# Patient Record
Sex: Female | Born: 1995 | Race: White | Hispanic: No | Marital: Single | Smoking: Never smoker
Health system: Southern US, Community
[De-identification: ages and names within clinical notes are randomized; demographics above are authoritative.]

## PROBLEM LIST (undated history)

## (undated) DIAGNOSIS — O139 Gestational [pregnancy-induced] hypertension without significant proteinuria, unspecified trimester: Secondary | ICD-10-CM

## (undated) HISTORY — PX: WISDOM TOOTH EXTRACTION: SHX21

## (undated) HISTORY — PX: CHOLECYSTECTOMY: SHX55

---
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
## 2016-01-10 ENCOUNTER — Encounter: Payer: Self-pay | Admitting: Emergency Medicine

## 2016-01-10 ENCOUNTER — Emergency Department (INDEPENDENT_AMBULATORY_CARE_PROVIDER_SITE_OTHER)

## 2016-01-10 ENCOUNTER — Emergency Department (INDEPENDENT_AMBULATORY_CARE_PROVIDER_SITE_OTHER)
Admission: EM | Admit: 2016-01-10 | Discharge: 2016-01-10 | Disposition: A | Discharge status: Home / Self Care | Source: Home / Self Care | Attending: Family Medicine | Admitting: Family Medicine

## 2016-01-10 DIAGNOSIS — R509 Fever, unspecified: Secondary | ICD-10-CM

## 2016-01-10 DIAGNOSIS — R0781 Pleurodynia: Secondary | ICD-10-CM | POA: Diagnosis not present

## 2016-01-10 DIAGNOSIS — J40 Bronchitis, not specified as acute or chronic: Secondary | ICD-10-CM

## 2016-01-10 DIAGNOSIS — R05 Cough: Secondary | ICD-10-CM | POA: Diagnosis not present

## 2016-01-10 MED ORDER — AZITHROMYCIN 250 MG PO TABS: ORAL_TABLET | ORAL | 0 refills | Status: DC

## 2016-01-10 NOTE — ED Triage Notes (Signed)
Patient reports cough for 3 days that has led to pain in right lateral ribcage. She is breastfeeding her infant.

## 2016-01-10 NOTE — ED Provider Notes (Signed)
[facility]    CSN: [id number] Arrival date & time: 01/10/16  1638     History   Chief Complaint Chief Complaint  Patient presents with  . Cough  . Chest Pain    HPI Emily Proctor is a 20 y.o. female.   Patient complains of three day history of typical cold-like symptoms developing over several days, including mild sore throat, sinus congestio, fatigue, low grade fever, and cough.  She has now developed pleuritic pain in her right chest and occasional wheezing.  No shortness of breath.  She is presently breast feeding her infant.      Past Medical History:  Diagnosis Date  . Gestational hypertension    return to nml at pp exam    There are no active problems to display for this patient.   History reviewed. No pertinent surgical history.  OB History    No data available       Home Medications    Prior to Admission medications   Medication Sig Start Date End Date Taking? Authorizing Provider  Prenatal Vit-Fe Fumarate-FA (PRENATAL VITAMINS) 28-0.8 MG TABS Take by mouth.   Yes Historical Provider, MD  azithromycin (ZITHROMAX Z-PAK) 250 MG tablet Take 2 tabs today; then begin one tab once daily for 4 more days. 01/10/16   Lattie HawStephen A Aland Chestnutt, MD    Family History History reviewed. No pertinent family history.  Social History Social History  Substance Use Topics  . Smoking status: Never Smoker  . Smokeless tobacco: Never Used  . Alcohol use No     Allergies   Patient has no known allergies.   Review of Systems Review of Systems + sore throat + cough + pleuritic pain + wheezing + nasal congestion + post-nasal drainage No sinus pain/pressure No itchy/red eyes No earache No hemoptysis No SOB + fever, + chills No nausea No vomiting No abdominal pain No diarrhea No urinary symptoms No skin rash + fatigue No myalgias No headache Used OTC meds without relief   Physical Exam Triage Vital Signs ED Triage Vitals  Enc Vitals  Group     BP 01/10/16 1702 115/75     Pulse Rate 01/10/16 1702 107     Resp 01/10/16 1702 16     Temp 01/10/16 1702 98.7 F (37.1 C)     Temp Source 01/10/16 1702 Oral     SpO2 01/10/16 1702 97 %     Weight 01/10/16 1703 218 lb (98.9 kg)     Height 01/10/16 1703 5' 6.5" (1.689 m)     Head Circumference --      Peak Flow --      Pain Score 01/10/16 1705 1     Pain Loc --      Pain Edu? --      Excl. in GC? --    No data found.   Updated Vital Signs BP 115/75 (BP Location: Left Arm)   Pulse 107   Temp 98.7 F (37.1 C) (Oral)   Resp 16   Ht 5' 6.5" (1.689 m)   Wt 218 lb (98.9 kg)   LMP 08/07/2015 Comment: breast feeding  SpO2 97%   BMI 34.66 kg/m   Visual Acuity Right Eye Distance:   Left Eye Distance:   Bilateral Distance:    Right Eye Near:   Left Eye Near:    Bilateral Near:     Physical Exam  Pulmonary/Chest:        Nursing notes and Vital Signs reviewed. Appearance:  Patient appears stated age, and in no acute distress Eyes:  Pupils are equal, round, and reactive to light and accomodation.  Extraocular movement is intact.  Conjunctivae are not inflamed  Ears:  Canals normal.  Tympanic membranes normal.  Nose:  Mildly congested turbinates.  No sinus tenderness.   Pharynx:  Normal Neck:  Supple.  Tender enlarged posterior/lateral nodes are palpated bilaterally  Lungs:  Clear to auscultation.  Breath sounds are equal.  Moving air well. Chest:  Tenderness right chest as noted on diagram. Heart:  Regular rate and rhythm without murmurs, rubs, or gallops.  Abdomen:  Nontender without masses or hepatosplenomegaly.  Bowel sounds are present.  No CVA or flank tenderness.  Extremities:  No edema.  Skin:  No rash present.     UC Treatments / Results  Labs (all labs ordered are listed, but only abnormal results are displayed) Labs Reviewed - No data to display  EKG  EKG Interpretation None       Radiology No results found.  Procedures Procedures  (including critical care time)  Medications Ordered in UC Medications - No data to display   Initial Impression / Assessment and Plan / UC Course  I have reviewed the triage vital signs and the nursing notes.  Pertinent labs & imaging results that were available during my care of the patient were reviewed by me and considered in my medical decision making (see chart for details).  Clinical Course   Begin Z-pak for atypical coverage. Take plain guaifenesin (1200mg  extended release tabs such as Mucinex) twice daily, with plenty of water, for cough and congestion.  Get adequate rest.   May take Delsym Cough Suppressant at bedtime for nighttime cough.  Try warm salt water gargles for sore throat.  Stop all antihistamines for now, and other non-prescription cough/cold preparations. May take Ibuprofen 200mg , 4 tabs every 8 hours with food for chest/sternum discomfort.   Follow-up with family doctor if not improving about 7 to 10 days.      Final Clinical Impressions(s) / UC Diagnoses   Final diagnoses:  Bronchitis  Rib pain on right side    New Prescriptions Discharge Medication List as of 01/10/2016  7:30 PM    START taking these medications   Details  azithromycin (ZITHROMAX Z-PAK) 250 MG tablet Take 2 tabs today; then begin one tab once daily for 4 more days., Print         Lattie HawStephen A Mahoganie Basher, MD 01/18/16 [phone]

## 2016-01-10 NOTE — Discharge Instructions (Signed)
Take plain guaifenesin (1200mg  extended release tabs such as Mucinex) twice daily, with plenty of water, for cough and congestion.  Get adequate rest.   May take Delsym Cough Suppressant at bedtime for nighttime cough.  Try warm salt water gargles for sore throat.  Stop all antihistamines for now, and other non-prescription cough/cold preparations. May take Ibuprofen 200mg , 4 tabs every 8 hours with food for chest/sternum discomfort.   Follow-up with family doctor if not improving about 7 to 10 days.

## 2017-06-12 ENCOUNTER — Emergency Department (INDEPENDENT_AMBULATORY_CARE_PROVIDER_SITE_OTHER)

## 2017-06-12 ENCOUNTER — Emergency Department (INDEPENDENT_AMBULATORY_CARE_PROVIDER_SITE_OTHER)
Admission: EM | Admit: 2017-06-12 | Discharge: 2017-06-12 | Disposition: A | Discharge status: Home / Self Care | Source: Home / Self Care | Attending: Emergency Medicine | Admitting: Emergency Medicine

## 2017-06-12 ENCOUNTER — Encounter: Payer: Self-pay | Admitting: Emergency Medicine

## 2017-06-12 ENCOUNTER — Other Ambulatory Visit: Payer: Self-pay

## 2017-06-12 DIAGNOSIS — X501XXA Overexertion from prolonged static or awkward postures, initial encounter: Secondary | ICD-10-CM

## 2017-06-12 DIAGNOSIS — S6981XA Other specified injuries of right wrist, hand and finger(s), initial encounter: Secondary | ICD-10-CM

## 2017-06-12 DIAGNOSIS — S5331XA Traumatic rupture of right ulnar collateral ligament, initial encounter: Secondary | ICD-10-CM

## 2017-06-12 DIAGNOSIS — S63641A Sprain of metacarpophalangeal joint of right thumb, initial encounter: Secondary | ICD-10-CM

## 2017-06-12 NOTE — ED Triage Notes (Signed)
Right first finger hyper-extended 3 weeks ago, still is painful

## 2017-06-12 NOTE — Discharge Instructions (Addendum)
Wear splint as instructed. Please make an appointment to be seen next week at [facility] family medicine by Dr. Denyse Amass or Dr.Thekkekandam. Do not wear splint at night.

## 2017-06-12 NOTE — ED Provider Notes (Signed)
[facility]    CSN: [id number] Arrival date & time: 06/12/17  1524     History   Chief Complaint Chief Complaint  Patient presents with  . Finger Injury    HPI Brynlynn Walko is a 22 y.o. female.   HPI Patient states that 3 weeks ago she ran out to prevent her 42-year-old from running into the street and she suffered an extension injury to her right thumb.  She has been able to use her thumb but it has remained sore.  It does not feel as strong as her left thumb.  She has difficulty gripping a pencil and writing in school.  She has had improvement in her swelling but feels like she has some limitation of motion at the base of the thumb.  Last menstrual period was 2 weeks ago and she has not been sexually active since that time.   Past Medical History:  Diagnosis Date  . Gestational hypertension    return to nml at pp exam    There are no active problems to display for this patient.   History reviewed. No pertinent surgical history.  OB History   None      Home Medications    Prior to Admission medications   Medication Sig Start Date End Date Taking? Authorizing Provider  Prenatal Vit-Fe Fumarate-FA (PRENATAL VITAMINS) 28-0.8 MG TABS Take by mouth.    [provider]    Family History No family history on file.  Social History Social History   Tobacco Use  . Smoking status: Never Smoker  . Smokeless tobacco: Never Used  Substance Use Topics  . Alcohol use: No  . Drug use: Not on file     Allergies   Patient has no known allergies.   Review of Systems Review of Systems  Musculoskeletal:       Pain in the right thumb.  No discomfort in her wrist elbow or fingers.     Physical Exam Triage Vital Signs ED Triage Vitals  Enc Vitals Group     BP      Pulse      Resp      Temp      Temp src      SpO2      Weight      Height      Head Circumference      Peak Flow      Pain Score      Pain Loc      Pain Edu?    Excl. in GC?    No data found.  Updated Vital Signs BP 111/80 (BP Location: Right Arm)   Pulse 94   Temp 97.9 F (36.6 C) (Oral)   Ht  (1.702 m)   Wt 253 lb (114.8 kg)   LMP 05/25/2017   SpO2 98%   BMI 39.63 kg/m   Visual Acuity Right Eye Distance:   Left Eye Distance:   Bilateral Distance:    Right Eye Near:   Left Eye Near:    Bilateral Near:     Physical Exam  Constitutional: She appears well-developed and well-nourished.  Musculoskeletal:  There is tenderness at the base of the proximal phalanx of the right thumb.  There appears to be laxity of the ulnar collateral ligament.  There is discomfort noted with stressing of the medial and lateral collateral ligaments.  There is normal flexion extension.  Capillary filling is normal.     UC Treatments / Results  Labs (all labs ordered are listed, but only abnormal results are displayed) Labs Reviewed - No data to display  EKG None  Radiology Dg Finger Thumb Right  Result Date: 06/12/2017 CLINICAL DATA:  Right first MCP pain. Hyperextension injury to the right thumb 3 weeks ago. EXAM: RIGHT THUMB 2+V COMPARISON:  None. FINDINGS: There is no evidence of acute fracture or dislocation. Thumb IP and MCP joint space widths are preserved without significant arthropathic changes seen. A 5 mm densely sclerotic focus in the lunate may represent a bone island. The soft tissues are unremarkable. IMPRESSION: No acute osseous abnormality. Electronically Signed   By: Sebastian Ache M.D.   On: 06/12/2017 16:06    Procedures Procedures (including critical care time)  Medications Ordered in UC Medications - No data to display  Initial Impression / Assessment and Plan / UC Course  I have reviewed the triage vital signs and the nursing notes.  Pertinent labs & imaging results that were available during my care of the patient were reviewed by me and considered in my medical decision making (see chart for details).      Final  Clinical Impressions(s) / UC Diagnoses   Final diagnoses:  Gamekeeper's thumb of right hand, initial encounter     Discharge Instructions     Wear splint as instructed. Please make an appointment to be seen next week at [facility] family medicine by Dr. Denyse Amass or Dr.Thekkekandam. Do not wear splint at night.    ED Prescriptions    None     Controlled Substance Prescriptions [place] Controlled Substance Registry consulted? Not Applicable   Collene Gobble, MD 06/12/17 1640

## 2017-06-17 ENCOUNTER — Encounter: Payer: Self-pay | Admitting: Sports Medicine

## 2017-06-17 ENCOUNTER — Ambulatory Visit (INDEPENDENT_AMBULATORY_CARE_PROVIDER_SITE_OTHER): Payer: Self-pay | Admitting: Sports Medicine

## 2017-06-17 DIAGNOSIS — S5331XA Traumatic rupture of right ulnar collateral ligament, initial encounter: Secondary | ICD-10-CM

## 2017-06-17 DIAGNOSIS — S63641A Sprain of metacarpophalangeal joint of right thumb, initial encounter: Secondary | ICD-10-CM | POA: Insufficient documentation

## 2017-06-17 NOTE — Assessment & Plan Note (Signed)
Grade 1 ulnar collateral ligament injury, continue thumb loop. Over-the-counter analgesics, x-rays were negative. Try to avoid use of the thumb for the next week. Return to see me in 2 weeks.

## 2017-06-17 NOTE — Progress Notes (Signed)
Subjective:    I'm seeing this patient as a consultation for:  Emily Chris, MD  CC: gamekeepers thumb   HPI: Milarose Savich, a 22yo female, presents in clinic today with a cc of progressively worsening pain after hyperabducting her thumb while reaching for her son. Pt reports that 3 weeks ago, her son was running and while reaching out to grab her son, she  "pulled her thumb all the way back" . Pt reports that pain has progressively worsened in the last three weeks and has spiraled to include some numbness that does not radiate to her wrist or elbow.  Pt reports that she has noticed diminished ability to grip bottles and that it hurts to move her thumb.  Pt denies attempting anything (analgesics, ice, heat) to alleviate the pain, and reports that activity (writing) makes it worse. Pt originally presented to urgent care on 2nd May because the pain in her thumb had progressively worsened.  Pt was provided with brace for stabilization of the injured ligament  in urgent care .    Pt presents in clinic today with a brace on but reports that -paradoxically- the pain has increased within the last few days with utilization of the brace.    I reviewed the past medical history, family history, social history, surgical history, and allergies today and no changes were needed.  Please see the problem list section below in epic for further details.  Past Medical History: Past Medical History:  Diagnosis Date  . Gestational hypertension    return to nml at pp exam   Past Surgical History: No past surgical history on file. Social History: Social History   Socioeconomic History  . Marital status: Single    Spouse name: Not on file  . Number of children: Not on file  . Years of education: Not on file  . Highest education level: Not on file  Occupational History  . Not on file  Social Needs  . Financial resource strain: Not on file  . Food insecurity:    Worry: Not on file    Inability: Not on  file  . Transportation needs:    Medical: Not on file    Non-medical: Not on file  Tobacco Use  . Smoking status: Never Smoker  . Smokeless tobacco: Never Used  Substance and Sexual Activity  . Alcohol use: No  . Drug use: Not on file  . Sexual activity: Not on file  Lifestyle  . Physical activity:    Days per week: Not on file    Minutes per session: Not on file  . Stress: Not on file  Relationships  . Social connections:    Talks on phone: Not on file    Gets together: Not on file    Attends religious service: Not on file    Active member of club or organization: Not on file    Attends meetings of clubs or organizations: Not on file    Relationship status: Not on file  Other Topics Concern  . Not on file  Social History Narrative  . Not on file   Family History: No family history on file. Allergies: No Known Allergies Medications: See med rec.  Review of Systems: No headache, visual changes, nausea, vomiting, diarrhea, constipation, dizziness, abdominal pain, skin rash, fevers, chills, night sweats, weight loss, swollen lymph nodes, body aches, joint swelling, muscle aches, chest pain, shortness of breath, mood changes, visual or auditory hallucinations.   Objective:   General: Well Developed,  well nourished, and in no acute distress.  Neuro:  Extra-ocular muscles intact, able to move all 4 extremities, sensation grossly intact.  Deep tendon reflexes tested were normal. Psych: Alert and oriented, mood congruent with affect. ENT:  Ears and nose appear unremarkable.  Hearing grossly normal. Neck: Unremarkable overall appearance, trachea midline.  No visible thyroid enlargement. Eyes: Conjunctivae and lids appear unremarkable.  Pupils equal and round. Skin: Warm and dry, no rashes noted.  Cardiovascular: Pulses palpable, no extremity edema.  Right Wrist: Inspection normal with no visible erythema or swelling. ROM smooth and normal with good flexion and extension and  ulnar/radial deviation that is symmetrical with opposite wrist. Palpation is normal over metacarpals, navicular, lunate, and TFCC; tendons without tenderness/ swelling Some snuffbox tenderness that is presumed to be referred pain from the injury to her UCL of the MCP joint No tenderness over Canal of [place]. Strength 5/5 in all directions without pain.  Right Hand Passive ROM in all digits is full  Tenderness to palpation over MCP Resistance to medial stress on digits 1 and 5  is 4/5 and bilateral.   Resistance to downward force on the 1st digit is 4/5 and bilateral  Definite endpoint with radial stress to UCL of MCP with minimal pain elicited Definite endpoint with ulnar stress to RCL of MCP with no pain elicited  Impression and Recommendations:   This case required medical decision making of moderate complexity.  Assessment-Grade 1 Sprain of Ulnar Collateral Ligament (GameKeepers Thumb) Pt was educated about the pathophysiology of her diagnosis.  Pt  is advised to continue otc analgesics (ibuprofen), provide rest and stabilization to thumb by utilizing brace and  follow up in 2 weeks.  Pt was provided rehabilitative exercises hand out and advised to begin nightly exercises after about a week to maintain range of motion and improve strength of digit while digit is abducted.   ___________________________________________ Ihor Austin. Benjamin Stain, M.D., ABFM., CAQSM. Primary Care and Sports Medicine [facility]  Adjunct Instructor of Family Medicine  [employer]

## 2017-07-01 ENCOUNTER — Ambulatory Visit (INDEPENDENT_AMBULATORY_CARE_PROVIDER_SITE_OTHER): Payer: Self-pay | Admitting: Sports Medicine

## 2017-07-01 ENCOUNTER — Encounter: Payer: Self-pay | Admitting: Sports Medicine

## 2017-07-01 DIAGNOSIS — S63641D Sprain of metacarpophalangeal joint of right thumb, subsequent encounter: Secondary | ICD-10-CM

## 2017-07-01 DIAGNOSIS — S5331XD Traumatic rupture of right ulnar collateral ligament, subsequent encounter: Secondary | ICD-10-CM

## 2017-07-01 NOTE — Progress Notes (Signed)
  Subjective:    CC: Recheck thumb  HPI: This is a very pleasant 22 year old female, I am seeing her 2 weeks post gamekeepers injury to her thumb, she has been in a thumb loop, doing much better.  I reviewed the past medical history, family history, social history, surgical history, and allergies today and no changes were needed.  Please see the problem list section below in epic for further details.  Past Medical History: Past Medical History:  Diagnosis Date  . Gestational hypertension    return to nml at pp exam   Past Surgical History: No past surgical history on file. Social History: Social History   Socioeconomic History  . Marital status: Single    Spouse name: Not on file  . Number of children: Not on file  . Years of education: Not on file  . Highest education level: Not on file  Occupational History  . Not on file  Social Needs  . Financial resource strain: Not on file  . Food insecurity:    Worry: Not on file    Inability: Not on file  . Transportation needs:    Medical: Not on file    Non-medical: Not on file  Tobacco Use  . Smoking status: Never Smoker  . Smokeless tobacco: Never Used  Substance and Sexual Activity  . Alcohol use: No  . Drug use: Not on file  . Sexual activity: Not on file  Lifestyle  . Physical activity:    Days per week: Not on file    Minutes per session: Not on file  . Stress: Not on file  Relationships  . Social connections:    Talks on phone: Not on file    Gets together: Not on file    Attends religious service: Not on file    Active member of club or organization: Not on file    Attends meetings of clubs or organizations: Not on file    Relationship status: Not on file  Other Topics Concern  . Not on file  Social History Narrative  . Not on file   Family History: No family history on file. Allergies: No Known Allergies Medications: See med rec.  Review of Systems: No fevers, chills, night sweats, weight loss,  chest pain, or shortness of breath.   Objective:    General: Well Developed, well nourished, and in no acute distress.  Neuro: Alert and oriented x3, extra-ocular muscles intact, sensation grossly intact.  HEENT: Normocephalic, atraumatic, pupils equal round reactive to light, neck supple, no masses, no lymphadenopathy, thyroid nonpalpable.  Skin: Warm and dry, no rashes. Cardiac: Regular rate and rhythm, no murmurs rubs or gallops, no lower extremity edema.  Respiratory: Clear to auscultation bilaterally. Not using accessory muscles, speaking in full sentences. Right hand: No tenderness to palpation at the ulnar collateral ligament, good stability to the ligament and valgus stress.  Good strength, full range of motion.  Impression and Recommendations:    Gamekeeper's thumb, right Doing well, only a bit of pain, continue thumb loop for another week.   Return as needed.  ___________________________________________ Ihor Austin. Benjamin Stain, M.D., ABFM., CAQSM. Primary Care and Sports Medicine [facility]  Adjunct Instructor of Family Medicine  [employer]

## 2017-07-01 NOTE — Assessment & Plan Note (Signed)
Doing well, only a bit of pain, continue thumb loop for another week.   Return as needed.

## 2017-09-09 ENCOUNTER — Other Ambulatory Visit: Payer: Self-pay

## 2017-09-09 ENCOUNTER — Emergency Department (INDEPENDENT_AMBULATORY_CARE_PROVIDER_SITE_OTHER)
Admission: EM | Admit: 2017-09-09 | Discharge: 2017-09-09 | Disposition: A | Discharge status: Home / Self Care | Payer: Self-pay | Source: Home / Self Care

## 2017-09-09 ENCOUNTER — Emergency Department (INDEPENDENT_AMBULATORY_CARE_PROVIDER_SITE_OTHER): Payer: Self-pay

## 2017-09-09 DIAGNOSIS — M25572 Pain in left ankle and joints of left foot: Secondary | ICD-10-CM

## 2017-09-09 DIAGNOSIS — S93402A Sprain of unspecified ligament of left ankle, initial encounter: Secondary | ICD-10-CM

## 2017-09-09 NOTE — ED Triage Notes (Signed)
t was walking down the stairs yesterday, and on the bottom step rolled left ankle.  Iced and elevated, stiff this morning

## 2017-09-09 NOTE — ED Provider Notes (Signed)
[facility]    CSN: [id number] Arrival date & time: 09/09/17  1328     History   Chief Complaint Chief Complaint  Patient presents with  . Ankle Pain    HPI Emily Proctor is a 22 y.o. female.   The history is provided by the patient. No language interpreter was used.  Ankle Pain  Location:  Ankle Injury: yes   Mechanism of injury: fall   Fall:    Fall occurred:  Down stairs   Entrapped after fall: no   Ankle location:  L ankle Pain details:    Quality:  Aching   Radiates to:  Does not radiate   Severity:  Moderate   Timing:  Constant   Progression:  Partially resolved Foreign body present:  No foreign bodies Relieved by:  Nothing Worsened by:  Nothing Ineffective treatments:  None tried Pt tripped and fell down stairs.    Past Medical History:  Diagnosis Date  . Gestational hypertension    return to nml at pp exam    Patient Active Problem List   Diagnosis Date Noted  . Gamekeeper's thumb, right 06/17/2017    History reviewed. No pertinent surgical history.  OB History   None      Home Medications    Prior to Admission medications   Not on File    Family History History reviewed. No pertinent family history.  Social History Social History   Tobacco Use  . Smoking status: Never Smoker  . Smokeless tobacco: Never Used  Substance Use Topics  . Alcohol use: No  . Drug use: Not Currently     Allergies   Patient has no known allergies.   Review of Systems Review of Systems  All other systems reviewed and are negative.    Physical Exam Triage Vital Signs ED Triage Vitals [09/09/17 1406]  Enc Vitals Group     BP 119/84     Pulse Rate 67     Resp      Temp (!) 97.5 F (36.4 C)     Temp Source Oral     SpO2 98 %     Weight 251 lb (113.9 kg)     Height 5\' 7"  (1.702 m)     Head Circumference      Peak Flow      Pain Score 6     Pain Loc      Pain Edu?      Excl. in GC?    No data found.  Updated Vital  Signs BP 119/84 (BP Location: Right Arm)   Pulse 67   Temp (!) 97.5 F (36.4 C) (Oral)   Ht 5\' 7"  (1.702 m)   Wt 251 lb (113.9 kg)   SpO2 98%   BMI 39.31 kg/m   Visual Acuity Right Eye Distance:   Left Eye Distance:   Bilateral Distance:    Right Eye Near:   Left Eye Near:    Bilateral Near:     Physical Exam  Constitutional: She appears well-developed and well-nourished.  Musculoskeletal: She exhibits tenderness. She exhibits no deformity.  Swollen ankle,  Pain with range of motion,  nv and ns intact    Neurological: She is alert.  Skin: Skin is warm.  Psychiatric: She has a normal mood and affect.  Nursing note and vitals reviewed.    UC Treatments / Results  Labs (all labs ordered are listed, but only abnormal results are displayed) Labs Reviewed - No data to display  EKG None  Radiology Dg Ankle Complete Left  Result Date: 09/09/2017 CLINICAL DATA:  Rolling injury of the ankle while going down stairs 1 day ago. EXAM: LEFT ANKLE COMPLETE - 3+ VIEW COMPARISON:  None in PACs FINDINGS: The bones are subjectively adequately mineralized. There is no acute lateral malleolar fracture. The medial and posterior malleoli are intact. There is a tiny bony density that projects along the inferior aspect of the medial portion of the left tibia. This is not observed on the lateral view. The talar dome and the calcaneus are intact. The metatarsal bases are intact where visualized. There is mild diffuse soft tissue swelling. IMPRESSION: No definite acute fracture of the left ankle. I cannot exclude a tiny avulsion from the medial articular margin of the distal tibia. Electronically Signed   By: David  [place] M.D.   On: 09/09/2017 14:28    Procedures Procedures (including critical care time)  Medications Ordered in UC Medications - No data to display  Initial Impression / Assessment and Plan / UC Course  I have reviewed the triage vital signs and the nursing notes.  Pertinent  labs & imaging results that were available during my care of the patient were reviewed by me and considered in my medical decision making (see chart for details).     MDM   Pt counseled on possible avulsionfx.    Ice, elevate ASO and crutches  Final Clinical Impressions(s) / UC Diagnoses   Final diagnoses:  Sprain of left ankle, unspecified ligament, initial encounter   Discharge Instructions   None    ED Prescriptions    None     Controlled Substance Prescriptions [place] Controlled Substance Registry consulted? Not Applicable   Elson Areas, [place] 09/09/17 1507

## 2017-12-31 ENCOUNTER — Emergency Department (INDEPENDENT_AMBULATORY_CARE_PROVIDER_SITE_OTHER)
Admission: EM | Admit: 2017-12-31 | Discharge: 2017-12-31 | Disposition: A | Discharge status: Home / Self Care | Source: Home / Self Care | Attending: Family Medicine | Admitting: Family Medicine

## 2017-12-31 ENCOUNTER — Encounter: Payer: Self-pay | Admitting: Emergency Medicine

## 2017-12-31 DIAGNOSIS — N926 Irregular menstruation, unspecified: Secondary | ICD-10-CM | POA: Diagnosis not present

## 2017-12-31 LAB — POCT URINE PREGNANCY: Preg Test, Ur: NEGATIVE

## 2017-12-31 NOTE — Discharge Instructions (Signed)
°  Please call to schedule an appointment with a Primary Care/Family Medicine provider for ongoing healthcare needs as well as further evaluation and possible treatment of late menses.  Or, you may follow up with your previously established GYN for further evaluation of your late cycle.

## 2017-12-31 NOTE — ED Provider Notes (Signed)
[facility]    CSN: [id number] Arrival date & time: 12/31/17  1201     History   Chief Complaint Chief Complaint  Patient presents with  . Possible Pregnancy    HPI Emily Proctor is a 22 y.o. female.   HPI  Emily ElMadeline Sunde is a 22 y.o. female presenting to UC with concern for her cycle being late. LMP: 11/07/17.  She spotted about 3 weeks ago but typically has a heavier period for a week and is very regular up until now.  She had a negative UPT at home and is requesting a blood test.  She has a 2yo son and stopped birth control about 2 years ago. Pt states she is not trying but not protecting against pregnancy. Denies concern for STDs. Denies abdominal pain, nausea, or urinary symptoms. No new medications or OTC supplements. No increased stress in her life. She has not f/u with her OB/GYN since her son was born 2 years ago and she does not have a PCP.    Past Medical History:  Diagnosis Date  . Gestational hypertension    return to nml at pp exam    Patient Active Problem List   Diagnosis Date Noted  . Gamekeeper's thumb, right 06/17/2017    History reviewed. No pertinent surgical history.  OB History   None      Home Medications    Prior to Admission medications   Not on File    Family History History reviewed. No pertinent family history.  Social History Social History   Tobacco Use  . Smoking status: Never Smoker  . Smokeless tobacco: Never Used  Substance Use Topics  . Alcohol use: No  . Drug use: Not Currently     Allergies   Patient has no known allergies.   Review of Systems Review of Systems  Gastrointestinal: Negative for abdominal pain, nausea and vomiting.  Genitourinary: Positive for menstrual problem. Negative for dysuria, flank pain, vaginal bleeding, vaginal discharge and vaginal pain.  Musculoskeletal: Negative for back pain.  Neurological: Negative for dizziness and headaches.     Physical Exam Triage  Vital Signs ED Triage Vitals  Enc Vitals Group     BP 12/31/17 1228 133/81     Pulse Rate 12/31/17 1228 (!) 102     Resp --      Temp 12/31/17 1228 97.8 F (36.6 C)     Temp Source 12/31/17 1228 Oral     SpO2 12/31/17 1228 96 %     Weight 12/31/17 1229 255 lb (115.7 kg)     Height --      Head Circumference --      Peak Flow --      Pain Score 12/31/17 1229 0     Pain Loc --      Pain Edu? --      Excl. in GC? --    No data found.  Updated Vital Signs BP 133/81 (BP Location: Right Arm)   Pulse (!) 102   Temp 97.8 F (36.6 C) (Oral)   Wt 255 lb (115.7 kg)   SpO2 96%   BMI 39.94 kg/m   Visual Acuity Right Eye Distance:   Left Eye Distance:   Bilateral Distance:    Right Eye Near:   Left Eye Near:    Bilateral Near:     Physical Exam  Constitutional: She is oriented to person, place, and time. She appears well-developed and well-nourished.  HENT:  Head: Normocephalic and atraumatic.  Eyes: EOM are normal.  Neck: Normal range of motion.  Cardiovascular: Normal rate and regular rhythm.  Pulmonary/Chest: Effort normal. No respiratory distress.  Abdominal: Soft. She exhibits no distension. There is no tenderness. There is no CVA tenderness.  Musculoskeletal: Normal range of motion.  Neurological: She is alert and oriented to person, place, and time.  Skin: Skin is warm and dry.  Psychiatric: She has a normal mood and affect. Her behavior is normal.  Nursing note and vitals reviewed.    UC Treatments / Results  Labs (all labs ordered are listed, but only abnormal results are displayed) Labs Reviewed  HCG, QUANTITATIVE, PREGNANCY  POCT URINE PREGNANCY    EKG None  Radiology No results found.  Procedures Procedures (including critical care time)  Medications Ordered in UC Medications - No data to display  Initial Impression / Assessment and Plan / UC Course  I have reviewed the triage vital signs and the nursing notes.  Pertinent labs & imaging  results that were available during my care of the patient were reviewed by me and considered in my medical decision making (see chart for details).     UPT: negative in clinic Hcg quant pending.  Final Clinical Impressions(s) / UC Diagnoses   Final diagnoses:  Late menses     Discharge Instructions      Please call to schedule an appointment with a Primary Care/Family Medicine provider for ongoing healthcare needs as well as further evaluation and possible treatment of late menses.  Or, you may follow up with your previously established GYN for further evaluation of your late cycle.     ED Prescriptions    None     Controlled Substance Prescriptions [place] Controlled Substance Registry consulted? Not Applicable   Rolla Plate 12/31/17 1610

## 2017-12-31 NOTE — ED Triage Notes (Signed)
Pt states she is late for her cycle. LMP was 11/07/17. Had neg UPT at home.

## 2018-01-01 ENCOUNTER — Telehealth: Payer: Self-pay

## 2018-01-01 LAB — HCG, QUANTITATIVE, PREGNANCY: HCG, Total, QN: <2 m[IU]/mL

## 2018-01-01 NOTE — Telephone Encounter (Signed)
Left voice message with negative lab results.  Encouraged patient to call with questions or concerns.

## 2018-03-10 ENCOUNTER — Emergency Department (INDEPENDENT_AMBULATORY_CARE_PROVIDER_SITE_OTHER)
Admission: EM | Admit: 2018-03-10 | Discharge: 2018-03-10 | Disposition: A | Discharge status: Home / Self Care | Source: Home / Self Care | Attending: Family Medicine | Admitting: Family Medicine

## 2018-03-10 ENCOUNTER — Other Ambulatory Visit: Payer: Self-pay

## 2018-03-10 DIAGNOSIS — J029 Acute pharyngitis, unspecified: Secondary | ICD-10-CM

## 2018-03-10 LAB — POCT RAPID STREP A (OFFICE): Rapid Strep A Screen: NEGATIVE

## 2018-03-10 NOTE — ED Triage Notes (Signed)
Pt woke up with a sore throat this am.  Son had strep a little over a week ago.

## 2018-03-10 NOTE — Discharge Instructions (Addendum)
May take Ibuprofen 200mg , 4 tabs every 8 hours with food for sore throat. Try warm salt water gargles for sore throat.   If cold symptoms develop, try the following:  Take plain guaifenesin (1200mg  extended release tabs such as Mucinex) twice daily, with plenty of water, for cough and congestion.  May add Pseudoephedrine (30mg , one or two every 4 to 6 hours) for sinus congestion.  Get adequate rest.   May use Afrin nasal spray (or generic oxymetazoline) each morning for about 5 days and then discontinue.  Also recommend using saline nasal spray several times daily and saline nasal irrigation (AYR is a common brand).  Stop all antihistamines for now, and other non-prescription cough/cold preparations. May take Delsym Cough Suppressant at bedtime for nighttime cough.

## 2018-03-10 NOTE — ED Provider Notes (Signed)
[facility]    CSN: [id number] Arrival date & time: 03/10/18  1633     History   Chief Complaint Chief Complaint  Patient presents with  . Sore Throat    HPI Emily Proctor is a 23 y.o. female.   Patient awoke today with a sore throat, fatigue, and headache.  Later today she developed mild sinus congestion and myalgias, but no cough.  She denies fevers, chills, and sweats.  Her son had strep pharyngitis over a week ago.  The history is provided by the patient.    Past Medical History:  Diagnosis Date  . Gestational hypertension    return to nml at pp exam    Patient Active Problem List   Diagnosis Date Noted  . Gamekeeper's thumb, right 06/17/2017    Past Surgical History:  Procedure Laterality Date  . CHOLECYSTECTOMY    . WISDOM TOOTH EXTRACTION      OB History   No obstetric history on file.      Home Medications    Prior to Admission medications   Not on File    Family History No family history on file.  Social History Social History   Tobacco Use  . Smoking status: Never Smoker  . Smokeless tobacco: Never Used  Substance Use Topics  . Alcohol use: No  . Drug use: Not Currently     Allergies   Patient has no known allergies.   Review of Systems Review of Systems + sore throat No cough No pleuritic pain No wheezing + nasal congestion + post-nasal drainage No sinus pain/pressure No itchy/red eyes No earache No hemoptysis No SOB No fever/chills No nausea No vomiting No abdominal pain No diarrhea No urinary symptoms No skin rash + fatigue + myalgias + headache Used OTC meds without relief   Physical Exam Triage Vital Signs ED Triage Vitals  Enc Vitals Group     BP 03/10/18 1638 128/88     Pulse Rate 03/10/18 1638 (!) 119     Resp 03/10/18 1638 20     Temp 03/10/18 1638 98.1 F (36.7 C)     Temp Source 03/10/18 1638 Oral     SpO2 03/10/18 1638 96 %     Weight 03/10/18 1639 253 lb (114.8 kg)   Height 03/10/18 1639 5\' 7"  (1.702 m)     Head Circumference --      Peak Flow --      Pain Score 03/10/18 1639 3     Pain Loc --      Pain Edu? --      Excl. in GC? --    No data found.  Updated Vital Signs BP 128/88 (BP Location: Right Arm)   Pulse (!) 119   Temp 98.1 F (36.7 C) (Oral)   Resp 20   Ht 5\' 7"  (1.702 m)   Wt 114.8 kg   LMP 01/08/2018 Comment: had preg test prior to chole.  SpO2 96%   BMI 39.63 kg/m   Visual Acuity Right Eye Distance:   Left Eye Distance:   Bilateral Distance:    Right Eye Near:   Left Eye Near:    Bilateral Near:     Physical Exam Nursing notes and Vital Signs reviewed. Appearance:  Patient appears stated age, and in no acute distress Eyes:  Pupils are equal, round, and reactive to light and accomodation.  Extraocular movement is intact.  Conjunctivae are not inflamed  Ears:  Canals normal.  Tympanic membranes normal.  Nose:  Mildly congested turbinates.  No sinus tenderness.   Pharynx:  Mildly erythematous Neck:  Supple.  Enlarged posterior/lateral nodes are palpated bilaterally, tender to palpation on the left.   Lungs:  Clear to auscultation.  Breath sounds are equal.  Moving air well. Heart:  Regular rate and rhythm without murmurs, rubs, or gallops.  Abdomen:  Nontender without masses or hepatosplenomegaly.  Bowel sounds are present.  No CVA or flank tenderness.  Extremities:  No edema.  Skin:  No rash present.    UC Treatments / Results  Labs (all labs ordered are listed, but only abnormal results are displayed) Labs Reviewed  STREP A DNA PROBE  POCT RAPID STREP A (OFFICE) negative    EKG None  Radiology No results found.  Procedures Procedures (including critical care time)  Medications Ordered in UC Medications - No data to display  Initial Impression / Assessment and Plan / UC Course  I have reviewed the triage vital signs and the nursing notes.  Pertinent labs & imaging results that were available during my  care of the patient were reviewed by me and considered in my medical decision making (see chart for details).    There is no evidence of bacterial infection today.   Suspect early viral URI.  Treat symptomatically for now.  Throat culture pending. Followup with Family Doctor if not improved in about 10 days.   Final Clinical Impressions(s) / UC Diagnoses   Final diagnoses:  Pharyngitis, unspecified etiology     Discharge Instructions     May take Ibuprofen 200mg , 4 tabs every 8 hours with food for sore throat. Try warm salt water gargles for sore throat.   If cold symptoms develop, try the following:  Take plain guaifenesin (1200mg  extended release tabs such as Mucinex) twice daily, with plenty of water, for cough and congestion.  May add Pseudoephedrine (30mg , one or two every 4 to 6 hours) for sinus congestion.  Get adequate rest.   May use Afrin nasal spray (or generic oxymetazoline) each morning for about 5 days and then discontinue.  Also recommend using saline nasal spray several times daily and saline nasal irrigation (AYR is a common brand).  Stop all antihistamines for now, and other non-prescription cough/cold preparations. May take Delsym Cough Suppressant at bedtime for nighttime cough.       ED Prescriptions    None        Lattie Haw, MD 03/12/18 1156

## 2018-03-11 ENCOUNTER — Telehealth: Payer: Self-pay | Admitting: *Deleted

## 2018-03-11 LAB — STREP A DNA PROBE: GROUP A STREP PROBE: NOT DETECTED

## 2018-03-11 NOTE — Telephone Encounter (Signed)
Spoke to pt given Tcx results. She reports she is slightly better today.

## 2020-03-16 IMAGING — DX DG ANKLE COMPLETE 3+V*L*
3 series · 3 of 3 positions shown · non-contrast
Comparison: None in PACs

CLINICAL DATA: Rolling injury of the ankle while going down stairs
1 day ago.

EXAM:
LEFT ANKLE COMPLETE - 3+ VIEW

[ankle ap]
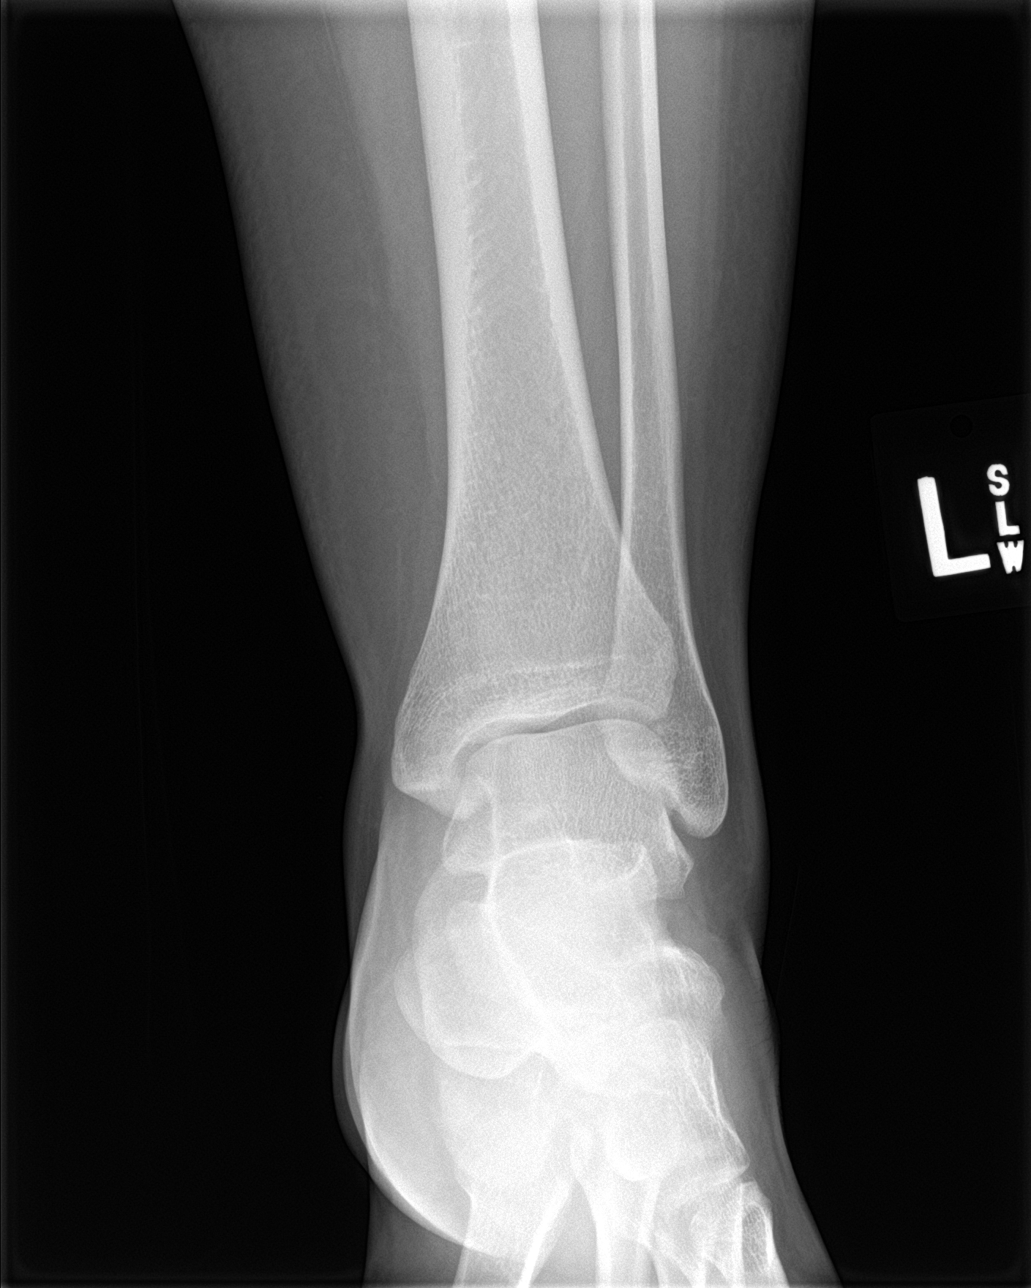

[ankle obl]
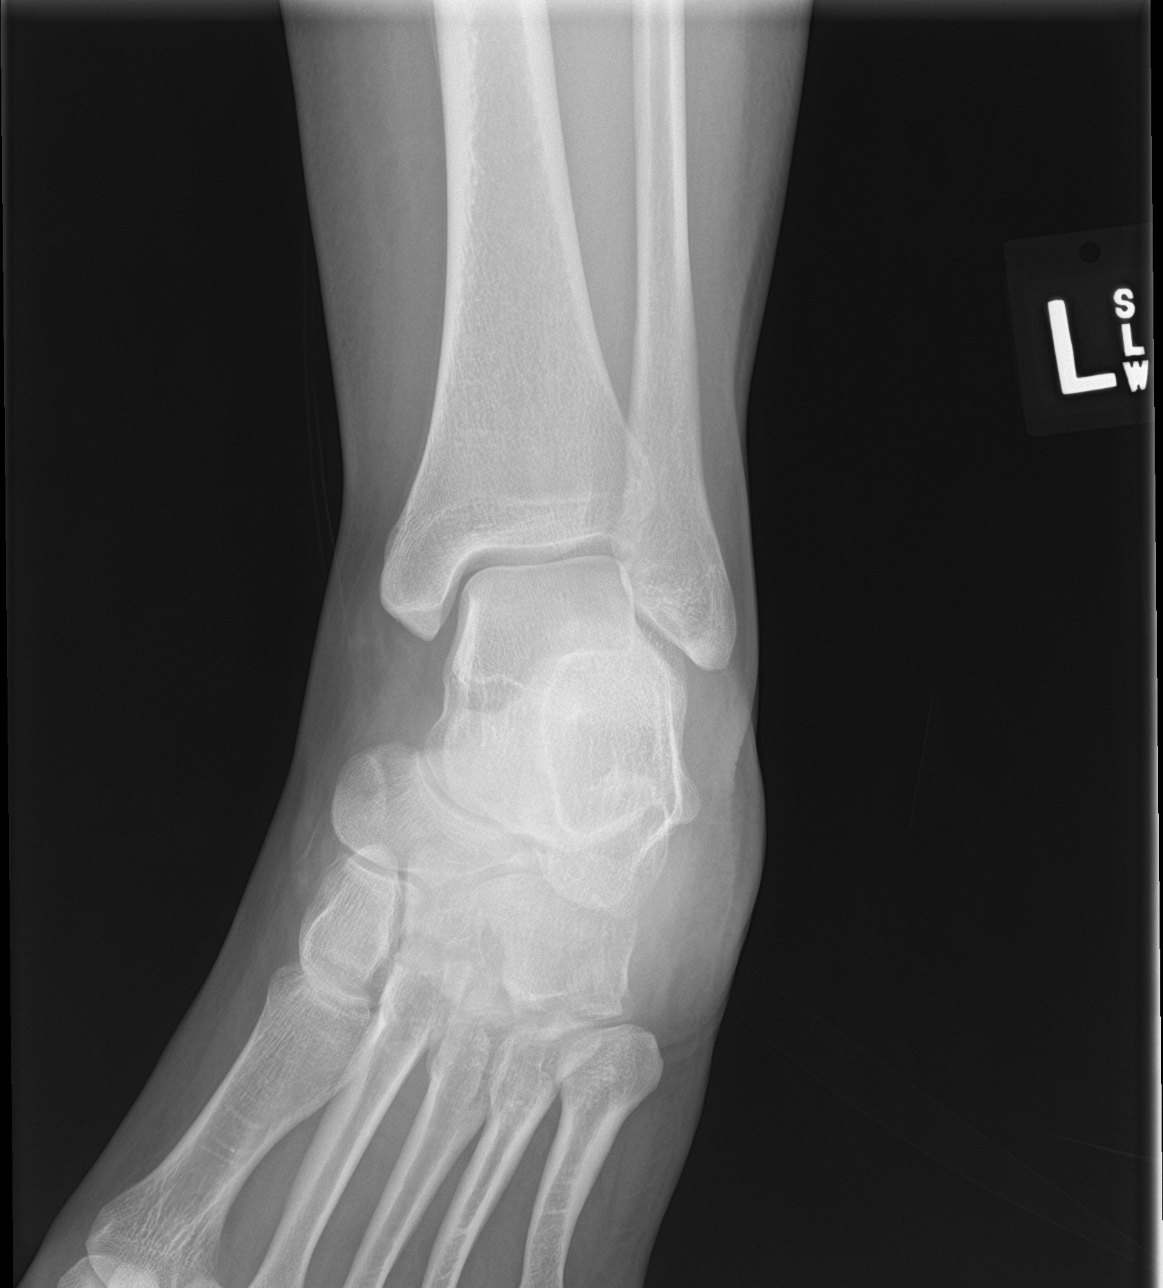

[ankle lat]
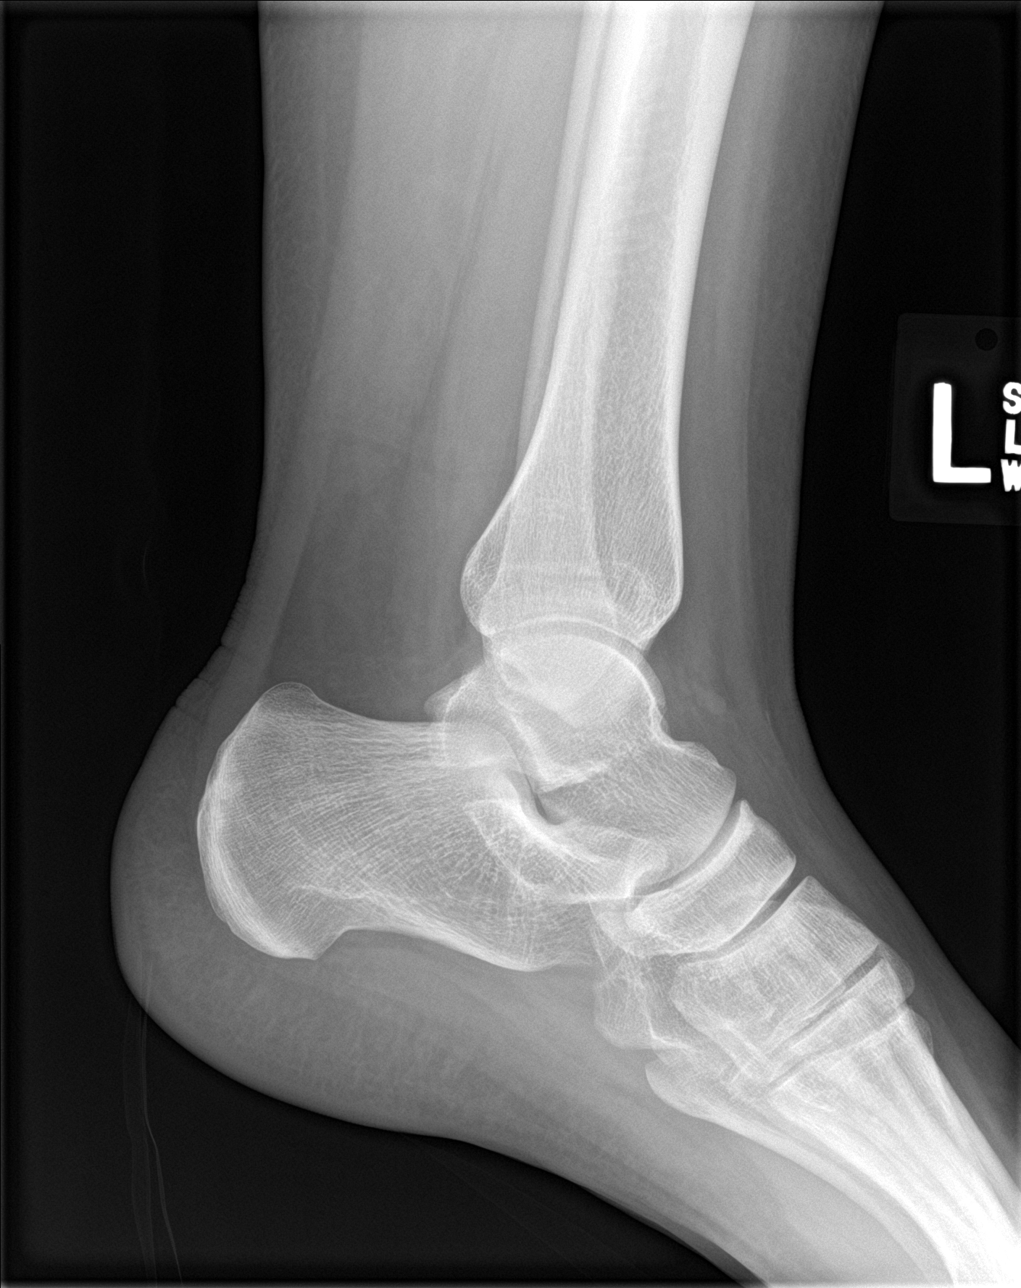

[3 of 3 positions shown; findings below may reference images not displayed]

FINDINGS: The bones are subjectively adequately mineralized. There is no acute
lateral malleolar fracture. The medial and posterior malleoli are
intact. There is a tiny bony density that projects along the
inferior aspect of the medial portion of the left tibia. This is not
observed on the lateral view. The talar dome and the calcaneus are
intact. The metatarsal bases are intact where visualized. There is
mild diffuse soft tissue swelling.
IMPRESSION: No definite acute fracture of the left ankle. I cannot exclude a
tiny avulsion from the medial articular margin of the distal tibia.

## 2020-12-18 ENCOUNTER — Other Ambulatory Visit: Payer: Self-pay

## 2020-12-18 ENCOUNTER — Emergency Department
Admission: EM | Admit: 2020-12-18 | Discharge: 2020-12-18 | Disposition: A | Discharge status: Home / Self Care | Source: Home / Self Care | Attending: Family Medicine | Admitting: Family Medicine

## 2020-12-18 ENCOUNTER — Encounter: Payer: Self-pay | Admitting: Emergency Medicine

## 2020-12-18 DIAGNOSIS — M5416 Radiculopathy, lumbar region: Secondary | ICD-10-CM

## 2020-12-18 MED ORDER — HYDROCODONE-ACETAMINOPHEN 5-325 MG PO TABS: 1.0000 | ORAL_TABLET | Freq: Four times a day (QID) | ORAL | 0 refills | Status: DC | PRN

## 2020-12-18 MED ORDER — ACETAMINOPHEN 325 MG PO TABS
650.0000 mg | ORAL_TABLET | Freq: Once | ORAL | Status: AC
  Administered 2020-12-18: 650 mg via ORAL

## 2020-12-18 MED ORDER — METHYLPREDNISOLONE 4 MG PO TBPK: ORAL_TABLET | ORAL | 0 refills | Status: DC

## 2020-12-18 NOTE — ED Provider Notes (Signed)
[facility]    CSN: [id number] Arrival date & time: 12/18/20  1610      History   Chief Complaint Chief Complaint  Patient presents with   Leg Pain    Right    HPI Emily Proctor is a 25 y.o. female.   HPI  Patient has pain from her buttock to her foot.  No numbness.  No weakness.  No accident or injury.  It happened when she stood up out of a chair.  No prior back problems.  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      Home Medications    Prior to Admission medications   Medication Sig Start Date End Date Taking? Authorizing Provider  HYDROcodone-acetaminophen (NORCO/VICODIN) 5-325 MG tablet Take 1-2 tablets by mouth every 6 (six) hours as needed. 12/18/20  Yes Eustace Moore, MD  methylPREDNISolone (MEDROL DOSEPAK) 4 MG TBPK tablet tad 12/18/20  Yes Eustace Moore, MD    Family History Family History  Problem Relation Age of Onset   Migraines Mother    Healthy Father     Social History Social History   Tobacco Use   Smoking status: Never   Smokeless tobacco: Never  Vaping Use   Vaping Use: Never used  Substance Use Topics   Alcohol use: No   Drug use: Not Currently     Allergies   Patient has no known allergies.   Review of Systems Review of Systems See HPI  Physical Exam Triage Vital Signs ED Triage Vitals  Enc Vitals Group     BP 12/18/20 1650 135/90     Pulse Rate 12/18/20 1650 (!) 107     Resp 12/18/20 1650 18     Temp 12/18/20 1650 98.7 F (37.1 C)     Temp Source 12/18/20 1650 Oral     SpO2 12/18/20 1650 97 %     Weight 12/18/20 1653 260 lb (117.9 kg)      Height 12/18/20 1653 5\' 7"  (1.702 m)     Head Circumference --      Peak Flow --      Pain Score 12/18/20 1652 6     Pain Loc --      Pain Edu? --      Excl. in GC? --    No data found.  Updated Vital Signs BP 135/90 (BP Location: Left Arm)   Pulse (!) 107   Temp 98.7 F (37.1 C) (Oral)   Resp 18   Ht 5\' 7"  (1.702 m)   Wt 117.9 kg   LMP 11/27/2020 (Exact Date)   SpO2 97%   BMI 40.72 kg/m      Physical Exam Constitutional:      General: She is not in acute distress.    Appearance: She is well-developed. She is obese.     Comments: Guarded movements  HENT:     Head: Normocephalic and atraumatic.     Mouth/Throat:     Comments: Mask in place Eyes:     Conjunctiva/sclera: Conjunctivae normal.     Pupils:  Pupils are equal, round, and reactive to light.  Cardiovascular:     Rate and Rhythm: Normal rate.  Pulmonary:     Effort: Pulmonary effort is normal. No respiratory distress.  Abdominal:     General: There is no distension.     Palpations: Abdomen is soft.  Musculoskeletal:        General: Normal range of motion.     Cervical back: Normal range of motion.     Comments: Tenderness over the right SI.  No tenderness over the greater trochanter.  Full range of motion of the spine.  Reflexes are 2+ and equal at the knee and ankle.  Strength and sensation intact.  Straight leg is in the right marginally causes increased pain its full extension  Skin:    General: Skin is warm and dry.  Neurological:     Mental Status: She is alert.     Gait: Gait normal.  Psychiatric:        Mood and Affect: Mood normal.        Behavior: Behavior normal.     UC Treatments / Results  Labs (all labs ordered are listed, but only abnormal results are displayed) Labs Reviewed - No data to display  EKG   Radiology No results found.  Procedures Procedures (including critical care time)  Medications Ordered in UC Medications  acetaminophen (TYLENOL) tablet 650 mg (650 mg Oral  Given 12/18/20 1700)    Initial Impression / Assessment and Plan / UC Course  I have reviewed the triage vital signs and the nursing notes.  Pertinent labs & imaging results that were available during my care of the patient were reviewed by me and considered in my medical decision making (see chart for details).     Final Clinical Impressions(s) / UC Diagnoses   Final diagnoses:  Lumbar radiculopathy     Discharge Instructions       Activity as tolerated Take the medrol dosepak as directed This will take down the nerve inflammation Take all of day one today Take the pain medicine as needed Do not drive on the pain medicine   ED Prescriptions     Medication Sig Dispense Auth. Provider   methylPREDNISolone (MEDROL DOSEPAK) 4 MG TBPK tablet tad 21 tablet Eustace Moore, MD   HYDROcodone-acetaminophen (NORCO/VICODIN) 5-325 MG tablet Take 1-2 tablets by mouth every 6 (six) hours as needed. 12 tablet Eustace Moore, MD      I have reviewed the PDMP during this encounter.   Eustace Moore, MD 12/18/20 [phone]

## 2020-12-18 NOTE — Discharge Instructions (Signed)
  Activity as tolerated Take the medrol dosepak as directed This will take down the nerve inflammation Take all of day one today Take the pain medicine as needed Do not drive on the pain medicine

## 2020-12-18 NOTE — ED Triage Notes (Signed)
R leg pain - started on Friday in thigh after getting up from a chair  Pain is now from R hip down to R ankle  Rested over the weekend  No ice or OTC meds  No  flu vaccine

## 2023-11-12 ENCOUNTER — Ambulatory Visit
Admission: EM | Admit: 2023-11-12 | Discharge: 2023-11-12 | Disposition: A | Discharge status: Home / Self Care | Payer: Self-pay | Attending: Family Medicine | Admitting: Family Medicine

## 2023-11-12 ENCOUNTER — Encounter: Payer: Self-pay | Admitting: Emergency Medicine

## 2023-11-12 ENCOUNTER — Ambulatory Visit: Payer: Self-pay

## 2023-11-12 DIAGNOSIS — M25511 Pain in right shoulder: Secondary | ICD-10-CM

## 2023-11-12 DIAGNOSIS — M25551 Pain in right hip: Secondary | ICD-10-CM

## 2023-11-12 DIAGNOSIS — M545 Low back pain, unspecified: Secondary | ICD-10-CM

## 2023-11-12 DIAGNOSIS — W108XXA Fall (on) (from) other stairs and steps, initial encounter: Secondary | ICD-10-CM

## 2023-11-12 DIAGNOSIS — S39012A Strain of muscle, fascia and tendon of lower back, initial encounter: Secondary | ICD-10-CM

## 2023-11-12 MED ORDER — NAPROXEN SODIUM 550 MG PO TABS: 550.0000 mg | ORAL_TABLET | Freq: Two times a day (BID) | ORAL | 0 refills | Status: AC

## 2023-11-12 MED ORDER — CYCLOBENZAPRINE HCL 5 MG PO TABS: 5.0000 mg | ORAL_TABLET | Freq: Three times a day (TID) | ORAL | 0 refills | Status: AC | PRN

## 2023-11-12 NOTE — ED Provider Notes (Signed)
 [name]    CSN: [id number] Arrival date & time: 11/12/23  1530      History   Chief Complaint Chief Complaint  Patient presents with   Fall    HPI Emily Proctor is a 28 y.o. female.   Patient fell down some stairs on Sunday.  Today is Wednesday.  She has deep purple ecchymosis on her right hip, pain in her hip and her low back.  She also has pain in her right shoulder and difficulty raising her arm.  She did not hit her head or have loss of consciousness.  No other pain to extremities.  She landed on a concrete surface in the garage.  Her husband had to get her up.  She stopped taking Tylenol  and ibuprofen, they are limiting her activity, still complains of pain.  Certain movements are very painful.  Weight on her right leg is painful.  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      Home Medications    Prior to Admission medications   Medication Sig Start Date End Date Taking? Authorizing Provider  cyclobenzaprine (FLEXERIL) 5 MG tablet Take 1-2 tablets (5-10 mg total) by mouth 3 (three) times daily as needed for muscle spasms. 11/12/23  Yes Maranda Jamee Jacob, MD  naproxen sodium (ANAPROX DS) 550 MG tablet Take 1 tablet (550 mg total) by mouth 2 (two) times daily with a meal. 11/12/23  Yes Maranda Jamee Jacob, MD    Family History Family History  Problem Relation Age of Onset   Migraines Mother    Healthy Father     Social History Social History   Tobacco Use   Smoking status: Never   Smokeless tobacco: Never  Vaping Use   Vaping status: Never Used  Substance Use Topics    Alcohol use: No   Drug use: Not Currently     Allergies   Patient has no known allergies.   Review of Systems Review of Systems See HPI  Physical Exam Triage Vital Signs ED Triage Vitals  Encounter Vitals Group     BP 11/12/23 1541 (!) 139/94     Girls Systolic BP Percentile --      Girls Diastolic BP Percentile --      Boys Systolic BP Percentile --      Boys Diastolic BP Percentile --      Pulse Rate 11/12/23 1541 (!) 122     Resp 11/12/23 1541 18     Temp 11/12/23 1541 97.8 F (36.6 C)     Temp Source 11/12/23 1541 Oral     SpO2 11/12/23 1541 96 %     Weight 11/12/23 1543 270 lb (122.5 kg)     Height 11/12/23 1543 5' 7 (1.702 m)     Head Circumference --      Peak Flow --      Pain Score 11/12/23 1543 8     Pain Loc --      Pain Education --      Exclude from Growth Chart --  No data found.  Updated Vital Signs BP (!) 139/94 (BP Location: Right Arm)   Pulse (!) 122   Temp 97.8 F (36.6 C) (Oral)   Resp 18   Ht 5' 7 (1.702 m)   Wt 122.5 kg   LMP 10/24/2023 (Exact Date)   SpO2 96%   BMI 42.29 kg/m        Physical Exam Constitutional:      General: She is in acute distress.     Appearance: She is well-developed. She is obese.     Comments: Patient is uncomfortable.  Stiff guarded movements  HENT:     Head: Normocephalic and atraumatic.  Eyes:     Conjunctiva/sclera: Conjunctivae normal.     Pupils: Pupils are equal, round, and reactive to light.  Cardiovascular:     Rate and Rhythm: Normal rate.  Pulmonary:     Effort: Pulmonary effort is normal. No respiratory distress.  Abdominal:     General: There is no distension.     Palpations: Abdomen is soft.  Musculoskeletal:        General: Tenderness and signs of injury present. Normal range of motion.     Cervical back: Normal range of motion.     Comments: Tenderness over the right AC joint.  Pain with abduction of arm above 90 degrees.  No pain with rotation.  Strength appears intact  there is no tenderness over the lumbar spine spinous processes.  There is tenderness over the right SI joint.  There is tenderness over the right greater trochanter.  There is a deep purple ecchymosis overlying the greater trochanter and buttock region.  Hip range of motion is full.  Neurovascular exam is normal  Skin:    General: Skin is warm and dry.  Neurological:     Mental Status: She is alert.     Gait: Gait abnormal.      UC Treatments / Results  Labs (all labs ordered are listed, but only abnormal results are displayed) Labs Reviewed - No data to display  EKG   Radiology DG Shoulder Right Result Date: 11/12/2023 CLINICAL DATA:  Right hip, back and shoulder pain after falling down stairs 3 days ago. EXAM: RIGHT SHOULDER - 2+ VIEW COMPARISON:  None Available. FINDINGS: There is no evidence of fracture or dislocation. There is no evidence of arthropathy or other focal bone abnormality. Soft tissues are unremarkable. IMPRESSION: Negative. Electronically Signed   By: Elspeth Bathe M.D.   On: 11/12/2023 17:03   DG Lumbar Spine Complete Result Date: 11/12/2023 CLINICAL DATA:  Right hip, back and shoulder pain after falling down stairs 3 days ago. EXAM: LUMBAR SPINE - COMPLETE 4+ VIEW COMPARISON:  None Available. FINDINGS: Five non-rib-bearing lumbar vertebrae and a transitional thoracolumbar vertebra. Mild anterior spur formation at the L3-4 level and minimal anterior spur formation at the L4-5 level. No fractures, pars defects or subluxations. IMPRESSION: 1. No fracture or subluxation. 2. Mild degenerative changes. Electronically Signed   By: Elspeth Bathe M.D.   On: 11/12/2023 17:03   DG Hip Unilat With Pelvis 2-3 Views Right Result Date: 11/12/2023 CLINICAL DATA:  Right hip, back and shoulder pain after falling down stairs 3 days ago. EXAM: DG HIP (WITH OR WITHOUT PELVIS) 2-3V RIGHT COMPARISON:  None Available. FINDINGS: There is no evidence of hip fracture or dislocation. There is no  evidence of arthropathy or other focal bone abnormality. IMPRESSION: Negative. Electronically Signed   By: Elspeth Bathe M.D.   On: 11/12/2023 17:01  Procedures Procedures (including critical care time)  Medications Ordered in UC Medications - No data to display  Initial Impression / Assessment and Plan / UC Course  I have reviewed the triage vital signs and the nursing notes.  Pertinent labs & imaging results that were available during my care of the patient were reviewed by me and considered in my medical decision making (see chart for details).     I did review the [employer] drug and lactation database for safety of prescriptions Negative x-rays are reviewed with patient Final Clinical Impressions(s) / UC Diagnoses   Final diagnoses:  Fall down stairs, initial encounter  Acute pain of right hip  Right anterior shoulder pain  Strain of lumbar region, initial encounter     Discharge Instructions      Stop ibuprofen Take naproxen 2 times a day with food Take Flexeril as needed as muscle relaxer.  This may cause drowsiness.  Take 1 or 2 at bedtime Make sure you are drinking plenty of water See your doctor if not improving by next week     ED Prescriptions     Medication Sig Dispense Auth. Provider   naproxen sodium (ANAPROX DS) 550 MG tablet Take 1 tablet (550 mg total) by mouth 2 (two) times daily with a meal. 30 tablet Maranda Jamee Jacob, MD   cyclobenzaprine (FLEXERIL) 5 MG tablet Take 1-2 tablets (5-10 mg total) by mouth 3 (three) times daily as needed for muscle spasms. 30 tablet Maranda Jamee Jacob, MD      PDMP not reviewed this encounter.   Maranda Jamee Jacob, MD 11/12/23 [phone]

## 2023-11-12 NOTE — Discharge Instructions (Signed)
 Stop ibuprofen Take naproxen 2 times a day with food Take Flexeril as needed as muscle relaxer.  This may cause drowsiness.  Take 1 or 2 at bedtime Make sure you are drinking plenty of water See your doctor if not improving by next week

## 2023-11-12 NOTE — ED Triage Notes (Signed)
 Patient states that she slipped down 3 stairs on Sunday, injuring right shoulder, buttocks, lower back and left foot pain.  Patient has taken Ibuprofen.  No LOC.

## 2023-11-13 ENCOUNTER — Telehealth: Payer: Self-pay | Admitting: Emergency Medicine

## 2023-11-13 NOTE — Telephone Encounter (Signed)
LMTRC.  Advised if doing well to disregard the call.  Any questions or concerns, feel free to contact the office. 

## 2023-12-16 ENCOUNTER — Ambulatory Visit
Admission: EM | Admit: 2023-12-16 | Discharge: 2023-12-16 | Disposition: A | Discharge status: Home / Self Care | Payer: Self-pay | Attending: Family Medicine | Admitting: Family Medicine

## 2023-12-16 ENCOUNTER — Encounter: Payer: Self-pay | Admitting: Emergency Medicine

## 2023-12-16 DIAGNOSIS — R42 Dizziness and giddiness: Secondary | ICD-10-CM

## 2023-12-16 DIAGNOSIS — M25511 Pain in right shoulder: Secondary | ICD-10-CM

## 2023-12-16 DIAGNOSIS — R4189 Other symptoms and signs involving cognitive functions and awareness: Secondary | ICD-10-CM

## 2023-12-16 LAB — POCT URINALYSIS DIP (MANUAL ENTRY)
Bilirubin, UA: NEGATIVE
Blood, UA: NEGATIVE
Glucose, UA: NEGATIVE mg/dL
Ketones, POC UA: NEGATIVE mg/dL
Leukocytes, UA: NEGATIVE
Nitrite, UA: NEGATIVE
Protein Ur, POC: NEGATIVE mg/dL
Spec Grav, UA: >=1.03 — AB (ref 1.010–1.025)
Urobilinogen, UA: 0.2 U/dL
pH, UA: 5.5 (ref 5.0–8.0)

## 2023-12-16 LAB — POCT URINE PREGNANCY: Preg Test, Ur: NEGATIVE

## 2023-12-16 NOTE — ED Triage Notes (Addendum)
 Pt reports feeling off, dizziness, loss of appetite for the past 3/4 days, and menstrual cycle is 17 days late. States she has had 3 neg home pregnancy test.   Also adds right shoulder pain that began hurting today. Has not taken any otc medication. Denies any obvious injuries to the shoulder.

## 2023-12-16 NOTE — ED Provider Notes (Signed)
 [name]    CSN: [id number] Arrival date & time: 12/16/23  1256      History   Chief Complaint Chief Complaint  Patient presents with   Dizziness   Shoulder Pain    HPI Emily Proctor is a 28 y.o. female.   Patient is here for 3 issues.  First, her menstrual period is 17 days late and she had unprotected.  She had a negative pregnancy test at home but would like to have this repeated Second, she has been feeling a mental fog.  She feels slow in her responses.  Feels well.  She refers to it is dizziness but she does not have any spinning or lightheaded sensation.  Normal vision and hearing.  No cold or sore throat.  No ear pressure or pain.  No head injury.  Admits to some mental stress.  States she has had a decreased appetite.  No nausea or vomiting.  No diarrhea. The third issue is that she woke up this morning with right shoulder pain.  She had lifted a heavy box yesterday.  No fall or injury.  She has not taken any medication.    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      Home Medications    Prior to Admission medications   Medication Sig Start Date End Date Taking? Authorizing Provider  cyclobenzaprine (FLEXERIL) 5 MG tablet Take 1-2 tablets (5-10 mg total) by mouth 3 (three) times daily as needed for muscle spasms. 11/12/23   Maranda Jamee Jacob, MD  naproxen sodium (ANAPROX DS) 550 MG tablet Take 1 tablet (550 mg total) by mouth 2 (two) times daily with a meal. 11/12/23   Maranda Jamee Jacob, MD    Family History Family History  Problem Relation Age of Onset   Migraines Mother    Healthy Father     Social History Social History   Tobacco  Use   Smoking status: Never   Smokeless tobacco: Never  Vaping Use   Vaping status: Never Used  Substance Use Topics   Alcohol use: No   Drug use: Not Currently     Allergies   Patient has no known allergies.   Review of Systems Review of Systems See HPI  Physical Exam Triage Vital Signs ED Triage Vitals  Encounter Vitals Group     BP 12/16/23 1310 (!) 143/89     Girls Systolic BP Percentile --      Girls Diastolic BP Percentile --      Boys Systolic BP Percentile --      Boys Diastolic BP Percentile --      Pulse Rate 12/16/23 1310 83     Resp 12/16/23 1310 18     Temp 12/16/23 1310 97.9 F (36.6 C)     Temp Source 12/16/23 1310 Oral     SpO2 12/16/23 1310 97 %     Weight --      Height --      Head Circumference --      Peak Flow --      Pain Score 12/16/23 1307 3     Pain  Loc --      Pain Education --      Exclude from Growth Chart --    No data found.  Updated Vital Signs BP (!) 143/89 (BP Location: Right Arm)   Pulse 83   Temp 97.9 F (36.6 C) (Oral)   Resp 18   LMP 10/24/2023 (Approximate)   SpO2 97%      Physical Exam Constitutional:      General: She is not in acute distress.    Appearance: She is well-developed.  HENT:     Head: Normocephalic and atraumatic.     Right Ear: Tympanic membrane normal.     Left Ear: Tympanic membrane normal.     Nose: Nose normal.     Mouth/Throat:     Mouth: Mucous membranes are moist.  Eyes:     Conjunctiva/sclera: Conjunctivae normal.     Pupils: Pupils are equal, round, and reactive to light.     Comments: Discs are flat  Cardiovascular:     Rate and Rhythm: Normal rate and regular rhythm.     Heart sounds: Normal heart sounds.  Pulmonary:     Effort: Pulmonary effort is normal. No respiratory distress.     Breath sounds: Normal breath sounds.  Abdominal:     General: There is no distension.     Palpations: Abdomen is soft.  Musculoskeletal:        General: Tenderness present. No swelling or  signs of injury. Normal range of motion.     Cervical back: Normal range of motion.     Comments: Full range of motion in right shoulder but pain with reaching overhead.  Mild tenderness in the posterior shoulder region.  No rotator cuff pain or limitation  Skin:    General: Skin is warm and dry.  Neurological:     Mental Status: She is alert.      UC Treatments / Results  Labs (all labs ordered are listed, but only abnormal results are displayed) Labs Reviewed  POCT URINALYSIS DIP (MANUAL ENTRY) - Abnormal; Notable for the following components:      Result Value   Spec Grav, UA >=1.030 (*)    All other components within normal limits  POCT URINE PREGNANCY - Normal  TSH  COMPREHENSIVE METABOLIC PANEL WITH GFR  CBC WITH DIFFERENTIAL/PLATELET    EKG   Radiology No results found.  Procedures Procedures (including critical care time)  Medications Ordered in UC Medications - No data to display  Initial Impression / Assessment and Plan / UC Course  I have reviewed the triage vital signs and the nursing notes.  Pertinent labs & imaging results that were available during my care of the patient were reviewed by me and considered in my medical decision making (see chart for details).     No medical problem identified for the foggy feeling.  Patient request blood work.  She has not had a physical for years.  She does not currently have health insurance. Shoulder strain is not worrisome. Final Clinical Impressions(s) / UC Diagnoses   Final diagnoses:  Dizziness of unknown etiology  Brain fog  Acute pain of right shoulder     Discharge Instructions      Take ibuprofen or naproxen as needed for your shoulder pain Ice may help Limit use of arm while shoulder is painful Check MyChart for your test results See a primary care doctor if your symptoms persist   ED Prescriptions   None    PDMP not  reviewed this encounter.   Maranda Jamee Jacob, MD 12/16/23 [phone]

## 2023-12-16 NOTE — Discharge Instructions (Signed)
 Take ibuprofen or naproxen as needed for your shoulder pain Ice may help Limit use of arm while shoulder is painful Check MyChart for your test results See a primary care doctor if your symptoms persist

## 2023-12-17 ENCOUNTER — Ambulatory Visit (HOSPITAL_COMMUNITY): Payer: Self-pay

## 2023-12-17 LAB — COMPREHENSIVE METABOLIC PANEL WITH GFR
ALT: 14 IU/L (ref 0–32)
AST: 14 IU/L (ref 0–40)
Albumin: 3.9 g/dL — ABNORMAL LOW (ref 4.0–5.0)
Alkaline Phosphatase: 100 IU/L (ref 41–116)
BUN/Creatinine Ratio: 21 (ref 9–23)
BUN: 14 mg/dL (ref 6–20)
Bilirubin Total: 0.5 mg/dL (ref 0.0–1.2)
CO2: 21 mmol/L (ref 20–29)
Calcium: 9 mg/dL (ref 8.7–10.2)
Chloride: 102 mmol/L (ref 96–106)
Creatinine, Ser: 0.68 mg/dL (ref 0.57–1.00)
Globulin, Total: 2.8 g/dL (ref 1.5–4.5)
Glucose: 78 mg/dL (ref 70–99)
Potassium: 4.7 mmol/L (ref 3.5–5.2)
Sodium: 136 mmol/L (ref 134–144)
Total Protein: 6.7 g/dL (ref 6.0–8.5)
eGFR: 122 mL/min/1.73 (ref >59)

## 2023-12-17 LAB — CBC WITH DIFFERENTIAL/PLATELET
Basophils Absolute: 0 x10E3/uL (ref 0.0–0.2)
Basos: 0 %
EOS (ABSOLUTE): 0.1 x10E3/uL (ref 0.0–0.4)
Eos: 2 %
Hematocrit: 40.1 % (ref 34.0–46.6)
Hemoglobin: 13 g/dL (ref 11.1–15.9)
Immature Grans (Abs): 0 x10E3/uL (ref 0.0–0.1)
Immature Granulocytes: 0 %
Lymphocytes Absolute: 3 x10E3/uL (ref 0.7–3.1)
Lymphs: 37 %
MCH: 26.5 pg — ABNORMAL LOW (ref 26.6–33.0)
MCHC: 32.4 g/dL (ref 31.5–35.7)
MCV: 82 fL (ref 79–97)
Monocytes Absolute: 0.6 x10E3/uL (ref 0.1–0.9)
Monocytes: 7 %
Neutrophils Absolute: 4.4 x10E3/uL (ref 1.4–7.0)
Neutrophils: 54 %
Platelets: 375 x10E3/uL (ref 150–450)
RBC: 4.91 x10E6/uL (ref 3.77–5.28)
RDW: 13.8 % (ref 11.7–15.4)
WBC: 8.1 x10E3/uL (ref 3.4–10.8)

## 2023-12-17 LAB — TSH: TSH: 1.63 u[IU]/mL (ref 0.450–4.500)
